# Patient Record
Sex: Female | Born: 1990 | Race: White | Hispanic: No | Marital: Married | Smoking: Current every day smoker
Health system: Southern US, Community
[De-identification: ages and names within clinical notes are randomized; demographics above are authoritative.]

## PROBLEM LIST (undated history)

## (undated) DIAGNOSIS — F329 Major depressive disorder, single episode, unspecified: Secondary | ICD-10-CM

## (undated) DIAGNOSIS — L709 Acne, unspecified: Secondary | ICD-10-CM

## (undated) DIAGNOSIS — F32A Depression, unspecified: Secondary | ICD-10-CM

## (undated) DIAGNOSIS — F4310 Post-traumatic stress disorder, unspecified: Secondary | ICD-10-CM

## (undated) DIAGNOSIS — K219 Gastro-esophageal reflux disease without esophagitis: Secondary | ICD-10-CM

## (undated) HISTORY — DX: Gastro-esophageal reflux disease without esophagitis: K21.9

## (undated) HISTORY — DX: Post-traumatic stress disorder, unspecified: F43.10

## (undated) HISTORY — DX: Depression, unspecified: F32.A

## (undated) HISTORY — DX: Major depressive disorder, single episode, unspecified: F32.9

## (undated) HISTORY — DX: Acne, unspecified: L70.9

---

## 2016-01-26 ENCOUNTER — Ambulatory Visit (INDEPENDENT_AMBULATORY_CARE_PROVIDER_SITE_OTHER): Payer: BLUE CROSS/BLUE SHIELD | Admitting: Physician Assistant

## 2016-01-26 ENCOUNTER — Encounter: Payer: Self-pay | Admitting: Physician Assistant

## 2016-01-26 DIAGNOSIS — M542 Cervicalgia: Secondary | ICD-10-CM

## 2016-01-26 DIAGNOSIS — F4310 Post-traumatic stress disorder, unspecified: Secondary | ICD-10-CM | POA: Insufficient documentation

## 2016-01-26 DIAGNOSIS — R200 Anesthesia of skin: Secondary | ICD-10-CM | POA: Insufficient documentation

## 2016-01-26 DIAGNOSIS — M5030 Other cervical disc degeneration, unspecified cervical region: Secondary | ICD-10-CM | POA: Insufficient documentation

## 2016-01-26 DIAGNOSIS — K219 Gastro-esophageal reflux disease without esophagitis: Secondary | ICD-10-CM | POA: Insufficient documentation

## 2016-01-26 DIAGNOSIS — F17200 Nicotine dependence, unspecified, uncomplicated: Secondary | ICD-10-CM | POA: Insufficient documentation

## 2016-01-26 LAB — CBC WITH DIFFERENTIAL/PLATELET
Basophils Absolute: 0 cells/uL (ref 0–200)
Basophils Relative: 0 %
Eosinophils Absolute: 132 {cells}/uL (ref 15–500)
Eosinophils Relative: 2 %
HCT: 42.3 % (ref 35.0–45.0)
Hemoglobin: 13.9 g/dL (ref 11.7–15.5)
Lymphocytes Relative: 35 %
Lymphs Abs: 2310 {cells}/uL (ref 850–3900)
MCH: 29.6 pg (ref 27.0–33.0)
MCHC: 32.9 g/dL (ref 32.0–36.0)
MCV: 90 fL (ref 80.0–100.0)
MPV: 10.8 fL (ref 7.5–12.5)
Monocytes Absolute: 330 {cells}/uL (ref 200–950)
Monocytes Relative: 5 %
Neutro Abs: 3828 {cells}/uL (ref 1500–7800)
Neutrophils Relative %: 58 %
Platelets: 206 K/uL (ref 140–400)
RBC: 4.7 MIL/uL (ref 3.80–5.10)
RDW: 13.3 % (ref 11.0–15.0)
WBC: 6.6 K/uL (ref 3.8–10.8)

## 2016-01-26 LAB — COMPREHENSIVE METABOLIC PANEL
Albumin: 4.3 g/dL (ref 3.6–5.1)
CO2: 26 mmol/L (ref 20–31)
Chloride: 107 mmol/L (ref 98–110)
Creat: 0.74 mg/dL (ref 0.50–1.10)
Potassium: 4.7 mmol/L (ref 3.5–5.3)
Sodium: 138 mmol/L (ref 135–146)

## 2016-01-26 LAB — COMPREHENSIVE METABOLIC PANEL WITH GFR
ALT: 12 U/L (ref 6–29)
AST: 18 U/L (ref 10–30)
Alkaline Phosphatase: 63 U/L (ref 33–115)
BUN: 10 mg/dL (ref 7–25)
Calcium: 9.1 mg/dL (ref 8.6–10.2)
Glucose, Bld: 93 mg/dL (ref 65–99)
Total Bilirubin: 0.4 mg/dL (ref 0.2–1.2)
Total Protein: 7 g/dL (ref 6.1–8.1)

## 2016-01-26 MED ORDER — MELOXICAM 15 MG PO TABS: ORAL_TABLET | ORAL | 3 refills | Status: AC

## 2016-01-26 MED ORDER — PREDNISONE 50 MG PO TABS: ORAL_TABLET | ORAL | 0 refills | Status: DC

## 2016-01-26 MED ORDER — CYCLOBENZAPRINE HCL 10 MG PO TABS: ORAL_TABLET | ORAL | 0 refills | Status: DC

## 2016-01-26 NOTE — Progress Notes (Signed)
   Subjective:    I'm seeing this patient as a consultation for: Gena Frayharley Cummings, PA-C   CC: Neck pain, migratory neurologic symptoms  HPI: This is a pleasant 26 year old female, she has obtained all of her previous care through the [facility]. Unfortunately she's had neck pain for several years, she tells me that she occasionally gets numbness and tingling going down her left arm or right arm, she's also had tingling, numbness going into her head, face, sometimes bilateral, sometimes unilateral, as well as down into her legs and her back. Symptoms are moderate, persistent. No bowel or bladder distention, saddle numbness, constitutional symptoms, no trauma.  Past medical history:  Negative.  See flowsheet/record as well for more information.  Surgical history: Negative.  See flowsheet/record as well for more information.  Family history: Negative.  See flowsheet/record as well for more information.  Social history: Negative.  See flowsheet/record as well for more information.  Allergies, and medications have been entered into the medical record, reviewed, and no changes needed.   Review of Systems: No headache, visual changes, nausea, vomiting, diarrhea, constipation, dizziness, abdominal pain, skin rash, fevers, chills, night sweats, weight loss, swollen lymph nodes, body aches, joint swelling, muscle aches, chest pain, shortness of breath, mood changes, visual or auditory hallucinations.   Objective:   General: Well Developed, well nourished, and in no acute distress.  Neuro/Psych: Alert and oriented x3, extra-ocular muscles intact, able to move all 4 extremities, sensation grossly intact. Skin: Warm and dry, no rashes noted.  Respiratory: Not using accessory muscles, speaking in full sentences, trachea midline.  Cardiovascular: Pulses palpable, no extremity edema. Abdomen: Does not appear distended. Neck: Negative spurling's Patient lacks some range of motion, particularly with rotation to the  left Grip strength and sensation normal in bilateral hands Strength good C4 to T1 distribution No sensory change to C4 to T1 Reflexes throughout even the lower extremity is are somewhat brisk, but a negative Hoffmann sign bilaterally and no clonus.  Impression and Recommendations:   This case required medical decision making of moderate complexity.  Numbness of face Migratory neurological symptoms with occasional numbness of extremities, face, neck, head. This is a young female with a family history of lupus. I am going to do a lupus panel as well as check a brain MRI with and without contrast looking for multiple sclerosis.   Neck pain Prednisone, meloxicam, Flexeril, formal physical therapy, neck x-rays.  Return to see me in one month.

## 2016-01-26 NOTE — Assessment & Plan Note (Signed)
Migratory neurological symptoms with occasional numbness of extremities, face, neck, head. This is a young female with a family history of lupus. I am going to do a lupus panel as well as check a brain MRI with and without contrast looking for multiple sclerosis.

## 2016-01-26 NOTE — Assessment & Plan Note (Signed)
Prednisone, meloxicam, Flexeril, formal physical therapy, neck x-rays.  Return to see me in one month.

## 2016-01-26 NOTE — Patient Instructions (Addendum)
Please make an appointment to follow-up with Dr. Benjamin Stainhekkekandam (sports medicine) in 1 month Referral has been placed for Physical Therapy. They will contact you or you can make an appointment in person today Go [facility] for your [facility] work

## 2016-01-26 NOTE — Progress Notes (Signed)
HPI:                                                                Debra OfficerBriana Ray is a 26 y.o. female who [facility]: Primary Care Sports Medicine today to establish care   Patient is an [employer] veteran who receives her primary and psychiatric care at the [facility]. She is here for a second opinion on chronic neck/back pain and tremors.   Patient reports chronic neck and back pain since 2012 from [employer] [place]. Pain is worse on the right side of her neck, present in the morning and worsens throughout the day. Patient is concerned about worsening pain and new sensory symptoms. She reports headache with associated numbness and tingling of her scalp and face. These symptoms began approximately 1 year ago. She is currently taking Gabapentin TID, which is not helping. Naproxen did not help with pain.  Patient is a current everyday smoker. She was prescribed Chantix, but had to discontinue the pill due to pustular acne. Not currently taking Chantix because she has no form of contraception. She is considering LARCs. Plans to followup with her doctor at the [facility].  She recently relocated from [place] and is living in [place] with her husband and 2 young daughters.  Health Maintenance Health Maintenance  Topic Date Due  . HIV Screening  08/22/2004  . PAP SMEAR  12/15/2018  . TETANUS/TDAP  01/14/2025  . INFLUENZA VACCINE  Completed    GYN/Sexual Health  Menstrual status: having periods  Menses: regular  Last pap smear: 2017  History of abnormal pap smears: no  Sexually active: yes, 1 female partner (husband)  Current contraception: no  Health Habits  Diet:  Ex[facility]: [facility] not [facility] or [facility]  ETOH: no  Tobacco: 1/2 ppd   Drugs:  Dental Exam:   Eye Exam:   Past Medical History:  Diagnosis Date  . Depression   . GERD (gastroesophageal reflux disease)   . PTSD (post-traumatic stress disorder)    No past surgical history  on file. Social History  Substance Use Topics  . Smoking status: Current Every Day Smoker    Packs/day: 0.50    Types: Cigarettes  . Smokeless tobacco: Never Used  . Alcohol use No   family history includes Fibromyalgia in her mother; Hypertension in her maternal grandfather and maternal grandmother; Lupus in her mother; Non-Hodgkin's lymphoma in her maternal grandmother.  Review of Systems  Constitutional: Positive for malaise/fatigue. Negative for chills, fever and weight loss.  Eyes: Positive for blurred vision (wears corrective lenses).  Respiratory: Negative for shortness of breath.   Cardiovascular: Negative for chest pain, palpitations and leg swelling.  Gastrointestinal: Positive for heartburn. Negative for blood in stool, constipation and diarrhea.  Genitourinary: Negative.   Musculoskeletal: Positive for back pain, myalgias and neck pain.  Neurological: Positive for tingling (hands), tremors, sensory change (hands) and headaches. Negative for focal weakness.       Paresthesias of the scalp and face + b/l hands in C7/C8 distribution  Psychiatric/Behavioral: The patient is nervous/anxious (ptsd).        Nightmares     Medications: Current Outpatient Prescriptions  Medication Sig Dispense Refill  . GABAPENTIN PO Take by mouth.    .Marland Kitchen  NAPROXEN PO Take by mouth.    . prazosin (MINIPRESS) 1 MG capsule Take 1 mg by mouth at bedtime.    Marland Kitchen RANITIDINE HCL PO Take by mouth.    . Zolpidem Tartrate (AMBIEN PO) Take by mouth.    . cyclobenzaprine (FLEXERIL) 10 MG tablet One half tab PO qHS, then increase gradually to one tab TID. 30 tablet 0  . meloxicam (MOBIC) 15 MG tablet One tab PO qAM with breakfast for 2 weeks, then daily prn pain. 30 tablet 3  . predniSONE (DELTASONE) 50 MG tablet One tab PO daily for 5 days. 5 tablet 0   No current facility-administered medications for this visit.    Allergies  Allergen Reactions  . Duloxetine        Objective:  BP 107/71   Pulse  74   Ht 5' (1.524 m)   Wt 135 lb (61.2 kg)   LMP 01/19/2016   BMI 26.37 kg/m  Gen: well-groomed, cooperative, not ill-appearing, no distress HEENT: normal conjunctiva, TM's clear, oropharynx clear, moist mucus membranes, no thyromegaly or tenderness Lungs: Normal work of breathing, clear to auscultation bilaterally Heart: Normal rate, regular rhythm, s1 and s2 distinct, no murmurs, clicks or rubs appreciated on this exam, no carotid bruit Abd: Soft. Nondistended, Nontender Neuro: alert and oriented x 3, EOM's intact, PERRLA, DTR's intact MSK: strength 5/5 and symmetric, normal gait Extremities: distal pulses intact, no peripheral edema Skin: warm and dry, no rashes or lesions on exposed skin Psych: flat affect, depressed mood, normal speech and thought content   No results found for this or any previous visit (from the past 72 hour(s)). No results found.    Assessment and Plan: 26 y.o. female with   Neck pain - consulted Dr. Benjamin Stain, Sports Medicine (see consult note). He has taken over care of musculoskeletal and neurological complaints - DG Cervical Spine Complete; Future - predniSONE (DELTASONE) 50 MG tablet; One tab PO daily for 5 days.  Dispense: 5 tablet; Refill: 0 - meloxicam (MOBIC) 15 MG tablet; One tab PO qAM with breakfast for 2 weeks, then daily prn pain.  Dispense: 30 tablet; Refill: 3 - cyclobenzaprine (FLEXERIL) 10 MG tablet; One half tab PO qHS, then increase gradually to one tab TID.  Dispense: 30 tablet; Refill: 0 - Ambulatory referral to Physical Therapy  Numbness of face - CBC with Differential/Platelet - Comprehensive metabolic panel - Sedimentation rate - ANA - MR BRAIN W WO CONTRAST   Patient education and anticipatory guidance given Patient agrees with treatment plan Follow-up in 1 month with Sports Medicine or sooner as needed  Levonne Hubert PA-C

## 2016-01-27 LAB — SEDIMENTATION RATE: Sed Rate: 1 mm/h (ref 0–20)

## 2016-01-29 ENCOUNTER — Ambulatory Visit (INDEPENDENT_AMBULATORY_CARE_PROVIDER_SITE_OTHER): Payer: BLUE CROSS/BLUE SHIELD

## 2016-01-29 DIAGNOSIS — R531 Weakness: Secondary | ICD-10-CM

## 2016-01-29 DIAGNOSIS — H538 Other visual disturbances: Secondary | ICD-10-CM

## 2016-01-29 DIAGNOSIS — R2681 Unsteadiness on feet: Secondary | ICD-10-CM

## 2016-01-29 DIAGNOSIS — R200 Anesthesia of skin: Secondary | ICD-10-CM

## 2016-01-29 LAB — ANA: Anti Nuclear Antibody(ANA): NEGATIVE

## 2016-01-29 MED ORDER — GADOBENATE DIMEGLUMINE 529 MG/ML IV SOLN
12.0000 mL | Freq: Once | INTRAVENOUS | Status: AC | PRN
  Administered 2016-01-29: 12 mL via INTRAVENOUS

## 2016-02-23 ENCOUNTER — Ambulatory Visit: Payer: BLUE CROSS/BLUE SHIELD | Admitting: Sports Medicine

## 2016-02-26 ENCOUNTER — Ambulatory Visit (INDEPENDENT_AMBULATORY_CARE_PROVIDER_SITE_OTHER): Payer: BLUE CROSS/BLUE SHIELD

## 2016-02-26 ENCOUNTER — Ambulatory Visit (INDEPENDENT_AMBULATORY_CARE_PROVIDER_SITE_OTHER): Payer: BLUE CROSS/BLUE SHIELD | Admitting: Sports Medicine

## 2016-02-26 DIAGNOSIS — G8929 Other chronic pain: Secondary | ICD-10-CM | POA: Diagnosis not present

## 2016-02-26 DIAGNOSIS — M542 Cervicalgia: Secondary | ICD-10-CM | POA: Diagnosis not present

## 2016-02-26 DIAGNOSIS — F4310 Post-traumatic stress disorder, unspecified: Secondary | ICD-10-CM

## 2016-02-26 MED ORDER — DULOXETINE HCL 30 MG PO CPEP: 30.0000 mg | ORAL_CAPSULE | Freq: Every day | ORAL | 3 refills | Status: DC

## 2016-02-26 NOTE — Assessment & Plan Note (Addendum)
Considering her PTSD, and vague reports of anxiety and panic associated with neck pain are going to add Cymbalta. She has very high PHQ9 and GAD7 scores. Return to see me in one month, we can repeat a PHQ9 and GAD7 that time. A do expect her uneasiness as well as her neck pain to improve with Cymbalta.

## 2016-02-26 NOTE — Progress Notes (Signed)
  Subjective:    CC: Follow-up  HPI: This is a pleasant 26 year old female, she is here for follow-up of neck pain, we treated her aggressively with prednisone, Flexeril, meloxicam as well as formal physical therapy which she has been doing at the [facility]. Has not yet gotten her neck x-ray but feels the symptoms are improving.  On further questioning she does feel some degree of uneasiness, and a sensation of her body vibrating. She does have a history of PTSD, and she has severe difficulty sleeping, poor energy, difficulty concentrating, psychomotor retardation, moderate anhedonia, depressed mood, changes in her appetite, and mild guilt, no suicidal or homicidal ideation, she also has severe nervousness, worrying about different things, trouble relaxing, irritability, fever of impending doom and moderate difficulty controlling her worry and restlessness.  Past medical history:  Negative.  See flowsheet/record as well for more information.  Surgical history: Negative.  See flowsheet/record as well for more information.  Family history: Negative.  See flowsheet/record as well for more information.  Social history: Negative.  See flowsheet/record as well for more information.  Allergies, and medications have been entered into the medical record, reviewed, and no changes needed.   Review of Systems: No fevers, chills, night sweats, weight loss, chest pain, or shortness of breath.   Objective:    General: Well Developed, well nourished, and in no acute distress.  Neuro: Alert and oriented x3, extra-ocular muscles intact, sensation grossly intact.  HEENT: Normocephalic, atraumatic, pupils equal round reactive to light, neck supple, no masses, no lymphadenopathy, thyroid nonpalpable.  Skin: Warm and dry, no rashes. Cardiac: Regular rate and rhythm, no murmurs rubs or gallops, no lower extremity edema.  Respiratory: Clear to auscultation bilaterally. Not using accessory muscles, speaking in full  sentences. Neck: Negative spurling's Full neck range of motion Grip strength and sensation normal in bilateral hands Strength good C4 to T1 distribution No sensory change to C4 to T1 Reflexes normal  Impression and Recommendations:    Neck pain Improving significantly, doing physical therapy at the [facility]. She does seem to have some degree of anxiety and myofascial pain, so we are going to add Cymbalta to the regimen. Return to see me in one month, if positive she can also follow this up with PCP. Also encouraged her to go ahead and go downstairs to get her x-ray.  PTSD (post-traumatic stress disorder) Considering her PTSD, and vague reports of anxiety and panic associated with neck pain are going to add Cymbalta. Return to see me in one month, we can repeat a PHQ9 and GAD7 that time. A do expect her uneasiness as well as her neck pain to improve with Cymbalta.

## 2016-02-26 NOTE — Assessment & Plan Note (Signed)
Improving significantly, doing physical therapy at the [facility]. She does seem to have some degree of anxiety and myofascial pain, so we are going to add Cymbalta to the regimen. Return to see me in one month, if positive she can also follow this up with PCP. Also encouraged her to go ahead and go downstairs to get her x-ray.

## 2016-04-01 ENCOUNTER — Ambulatory Visit (INDEPENDENT_AMBULATORY_CARE_PROVIDER_SITE_OTHER): Payer: BLUE CROSS/BLUE SHIELD | Admitting: Sports Medicine

## 2016-04-01 DIAGNOSIS — M545 Low back pain, unspecified: Secondary | ICD-10-CM | POA: Insufficient documentation

## 2016-04-01 DIAGNOSIS — G8929 Other chronic pain: Secondary | ICD-10-CM | POA: Diagnosis not present

## 2016-04-01 DIAGNOSIS — M542 Cervicalgia: Secondary | ICD-10-CM | POA: Diagnosis not present

## 2016-04-01 DIAGNOSIS — F4310 Post-traumatic stress disorder, unspecified: Secondary | ICD-10-CM | POA: Diagnosis not present

## 2016-04-01 NOTE — Progress Notes (Signed)
  Subjective:    CC: Follow-up  HPI: PTSD/anxiety/depression: Some improvement in symptoms on 30 mg of Cymbalta. She did just see her psychiatrist to increase her Cymbalta to 60 mg.  Neck pain: Persistent, likely due to myofascial pain syndrome, x-rays were negative but she continues to have subjective pain after greater than 6 weeks of conservative measures.  Low back pain: Has had an MRI of the lumbar spine, I'm unsure of the results at this point, she does have a disc with the [facility] and agrees to get this for my review.  Past medical history:  Negative.  See flowsheet/record as well for more information.  Surgical history: Negative.  See flowsheet/record as well for more information.  Family history: Negative.  See flowsheet/record as well for more information.  Social history: Negative.  See flowsheet/record as well for more information.  Allergies, and medications have been entered into the medical record, reviewed, and no changes needed.   Review of Systems: No fevers, chills, night sweats, weight loss, chest pain, or shortness of breath.   Objective:    General: Well Developed, well nourished, and in no acute distress.  Neuro: Alert and oriented x3, extra-ocular muscles intact, sensation grossly intact.  HEENT: Normocephalic, atraumatic, pupils equal round reactive to light, neck supple, no masses, no lymphadenopathy, thyroid nonpalpable.  Skin: Warm and dry, no rashes. Cardiac: Regular rate and rhythm, no murmurs rubs or gallops, no lower extremity edema.  Respiratory: Clear to auscultation bilaterally. Not using accessory muscles, speaking in full sentences.  Impression and Recommendations:    PTSD (post-traumatic stress disorder) Though with significant anxiety and depressive symptoms on Cymbalta 30, her psychiatrist has increased her dose to 60 mg but she has not yet started taking the new dose. She can return to see her PCP regarding her anxiety, and repeat PHQ9 and  GAD7.  Neck pain Slight improvement, x-rays were negative but considering duration of pain and failure of conservative measures to close the loop we are going to go ahead and get an MRI. Do suspect this will end up being secondary to myofascial pain syndrome.  Low back pain without sciatica Patient [facility], she will get a disc for my review.

## 2016-04-01 NOTE — Assessment & Plan Note (Signed)
Though with significant anxiety and depressive symptoms on Cymbalta 30, her psychiatrist has increased her dose to 60 mg but she has not yet started taking the new dose. She can return to see her PCP regarding her anxiety, and repeat PHQ9 and GAD7.

## 2016-04-01 NOTE — Assessment & Plan Note (Signed)
[facility], she will get a disc for my review.

## 2016-04-01 NOTE — Assessment & Plan Note (Signed)
Slight improvement, x-rays were negative but considering duration of pain and failure of conservative measures to close the loop we are going to go ahead and get an MRI. Do suspect this will end up being secondary to myofascial pain syndrome.

## 2016-04-08 ENCOUNTER — Ambulatory Visit (INDEPENDENT_AMBULATORY_CARE_PROVIDER_SITE_OTHER): Payer: BLUE CROSS/BLUE SHIELD

## 2016-04-08 DIAGNOSIS — M50122 Cervical disc disorder at C5-C6 level with radiculopathy: Secondary | ICD-10-CM | POA: Diagnosis not present

## 2016-04-08 DIAGNOSIS — M542 Cervicalgia: Secondary | ICD-10-CM

## 2016-04-29 ENCOUNTER — Ambulatory Visit (INDEPENDENT_AMBULATORY_CARE_PROVIDER_SITE_OTHER): Payer: BLUE CROSS/BLUE SHIELD | Admitting: Sports Medicine

## 2016-04-29 ENCOUNTER — Encounter: Payer: Self-pay | Admitting: Sports Medicine

## 2016-04-29 DIAGNOSIS — M545 Low back pain: Secondary | ICD-10-CM

## 2016-04-29 DIAGNOSIS — G8929 Other chronic pain: Secondary | ICD-10-CM | POA: Diagnosis not present

## 2016-04-29 DIAGNOSIS — M5030 Other cervical disc degeneration, unspecified cervical region: Secondary | ICD-10-CM | POA: Diagnosis not present

## 2016-04-29 NOTE — Assessment & Plan Note (Signed)
I'm still waiting for her to get her lumbar spine MRI disc from [facility]. Her pain is axial and discogenic. It has improved significantly with a higher dose of Cymbalta but right now this trumps her neck pain.

## 2016-04-29 NOTE — Assessment & Plan Note (Signed)
With right sided periscapular radicular pain. There is a small C5-C6 broad-based protrusion. Not hurting enough yet to consider an injection however I am going to get her with a new physical therapist.

## 2016-04-29 NOTE — Progress Notes (Signed)
  Subjective:    CC: Follow-up  HPI: This is a pleasant 26 year old female, she has both cervical and lumbar degenerative disc disease, she has been working with physical therapist and is also on Cymbalta. Overall she's doing well, she does have some right sided periscapular pain related to her cervical disc protrusion, she's also got midline axial and discogenic low back pain, worse with sitting, flexion, Valsalva. She is doing some work with her physical therapist but expresses some dissatisfaction in her treatment. She is going to look into getting a severely and physical therapist.  Past medical history:  Negative.  See flowsheet/record as well for more information.  Surgical history: Negative.  See flowsheet/record as well for more information.  Family history: Negative.  See flowsheet/record as well for more information.  Social history: Negative.  See flowsheet/record as well for more information.  Allergies, and medications have been entered into the medical record, reviewed, and no changes needed.   Review of Systems: No fevers, chills, night sweats, weight loss, chest pain, or shortness of breath.   Objective:    General: Well Developed, well nourished, and in no acute distress.  Neuro: Alert and oriented x3, extra-ocular muscles intact, sensation grossly intact.  HEENT: Normocephalic, atraumatic, pupils equal round reactive to light, neck supple, no masses, no lymphadenopathy, thyroid nonpalpable.  Skin: Warm and dry, no rashes. Cardiac: Regular rate and rhythm, no murmurs rubs or gallops, no lower extremity edema.  Respiratory: Clear to auscultation bilaterally. Not using accessory muscles, speaking in full sentences.  Impression and Recommendations:    DDD (degenerative disc disease), cervical With right sided periscapular radicular pain. There is a small C5-C6 broad-based protrusion. Not hurting enough yet to consider an injection however I am going to get her with a new  physical therapist.  Low back pain without sciatica I'm still waiting for her to get her lumbar spine MRI disc [facility]. Her pain is axial and discogenic. It has improved significantly with a higher dose of Cymbalta but right now this trumps her neck pain.  I spent 25 minutes with this patient, greater than 50% was face-to-face time counseling regarding the above diagnoses

## 2016-05-01 ENCOUNTER — Ambulatory Visit: Payer: BLUE CROSS/BLUE SHIELD | Admitting: Physician Assistant

## 2016-05-08 ENCOUNTER — Ambulatory Visit (INDEPENDENT_AMBULATORY_CARE_PROVIDER_SITE_OTHER): Payer: BLUE CROSS/BLUE SHIELD | Admitting: Physician Assistant

## 2016-05-08 VITALS — BP 104/65 | HR 62 | Wt 130.0 lb

## 2016-05-08 DIAGNOSIS — F4310 Post-traumatic stress disorder, unspecified: Secondary | ICD-10-CM | POA: Diagnosis not present

## 2016-05-08 DIAGNOSIS — F331 Major depressive disorder, recurrent, moderate: Secondary | ICD-10-CM | POA: Diagnosis not present

## 2016-05-08 MED ORDER — BUPROPION HCL ER (XL) 150 MG PO TB24: 150.0000 mg | ORAL_TABLET | Freq: Every day | ORAL | 5 refills | Status: AC

## 2016-05-08 NOTE — Patient Instructions (Signed)
- Start Wellbutrin with your Cymbalta in the morning - Follow-up in 4 weeks   Living With Depression Everyone experiences occasional disappointment, sadness, and loss in their lives. When you are feeling down, blue, or sad for at least 2 weeks in a row, it may mean that you have depression. Depression can affect your thoughts and feelings, relationships, daily activities, and physical health. It is caused by changes in the way your brain functions. If you receive a diagnosis of depression, your health care provider will tell you which type of depression you have and what treatment options are available to you. If you are living with depression, there are ways to help you recover from it and also ways to prevent it from coming back. How to cope with lifestyle changes Coping with stress  Stress is your body's reaction to life changes and events, both good and bad. Stressful situations may include:  Getting married.  The death of a spouse.  Losing a job.  Retiring.  Having a baby. Stress can last just a few hours or it can be ongoing. Stress can play a major role in depression, so it is important to learn both how to cope with stress and how to think about it differently. Talk with your health care provider or a counselor if you would like to learn more about stress reduction. He or she may suggest some stress reduction techniques, such as:  Music therapy. This can include creating music or listening to music. Choose music that you enjoy and that inspires you.  Mindfulness-based meditation. This kind of meditation can be done while sitting or walking. It involves being aware of your normal breaths, rather than trying to control your breathing.  Centering prayer. This is a kind of meditation that involves focusing on a spiritual word or phrase. Choose a word, phrase, or sacred image that is meaningful to you and that brings you peace.  Deep breathing. To do this, expand your stomach and  inhale slowly through your nose. Hold your breath for 3-5 seconds, then exhale slowly, allowing your stomach muscles to relax.  Muscle relaxation. This involves intentionally tensing muscles then relaxing them. Choose a stress reduction technique that fits your lifestyle and personality. Stress reduction techniques take time and practice to develop. Set aside 5-15 minutes a day to do them. Therapists can offer training in these techniques. The training may be covered by some insurance plans. Other things you can do to manage stress include:  Keeping a stress diary. This can help you learn what triggers your stress and ways to control your response.  Understanding what your limits are and saying no to requests or events that lead to a schedule that is too full.  Thinking about how you respond to certain situations. You may not be able to control everything, but you can control how you react.  Adding humor to your life by watching funny films or TV shows.  Making time for activities that help you relax and not feeling guilty about spending your time this way. Medicines  Your health care provider may suggest certain medicines if he or she feels that they will help improve your condition. Avoid using alcohol and other substances that may prevent your medicines from working properly (may interact). It is also important to:  Talk with your pharmacist or health care provider about all the medicines that you take, their possible side effects, and what medicines are safe to take together.  Make it your goal to  take part in all treatment decisions (shared decision-making). This includes giving input on the side effects of medicines. It is best if shared decision-making with your health care provider is part of your total treatment plan. If your health care provider prescribes a medicine, you may not notice the full benefits of it for 4-8 weeks. Most people who are treated for depression need to be on  medicine for at least 6-12 months after they feel better. If you are taking medicines as part of your treatment, do not stop taking medicines without first talking to your health care provider. You may need to have the medicine slowly decreased (tapered) over time to decrease the risk of harmful side effects. Relationships  Your health care provider may suggest family therapy along with individual therapy and drug therapy. While there may not be family problems that are causing you to feel depressed, it is still important to make sure your family learns as much as they can about your mental health. Having your family's support can help make your treatment successful. How to recognize changes in your condition Everyone has a different response to treatment for depression. Recovery from major depression happens when you have not had signs of major depression for two months. This may mean that you will start to:  Have more interest in doing activities.  Feel less hopeless than you did 2 months ago.  Have more energy.  Overeat less often, or have better or improving appetite.  Have better concentration. Your health care provider will work with you to decide the next steps in your recovery. It is also important to recognize when your condition is getting worse. Watch for these signs:  Having fatigue or low energy.  Eating too much or too little.  Sleeping too much or too little.  Feeling restless, agitated, or hopeless.  Having trouble concentrating or making decisions.  Having unexplained physical complaints.  Feeling irritable, angry, or aggressive. Get help as soon as you or your family members notice these symptoms coming back. How to get support and help from others How to talk with friends and family members about your condition  Talking to friends and family members about your condition can provide you with one way to get support and guidance. Reach out to trusted friends or family  members, explain your symptoms to them, and let them know that you are working with a health care provider to treat your depression. Financial resources  Not all insurance plans cover mental health care, so it is important to check with your insurance carrier. If paying for co-pays or counseling services is a problem, search for a local or county mental health care center. They may be able to offer public mental health care services at low or no cost when you are not able to see a private health care provider. If you are taking medicine for depression, you may be able to get the generic form, which may be less expensive. Some makers of prescription medicines also offer help to patients who cannot afford the medicines they need. Follow these instructions at home:  Get the right amount and quality of sleep.  Cut down on using caffeine, tobacco, alcohol, and other potentially harmful substances.  Try to exercise, such as walking or lifting small weights.  Take over-the-counter and prescription medicines only as told by your health care provider.  Eat a healthy diet that includes plenty of vegetables, fruits, whole grains, low-fat dairy products, and lean protein. Do not eat  a lot of foods that are high in solid fats, added sugars, or salt.  Keep all follow-up visits as told by your health care provider. This is important. Contact a health care provider if:  You stop taking your antidepressant medicines, and you have any of these symptoms:  Nausea.  Headache.  Feeling lightheaded.  Chills and body aches.  Not being able to sleep (insomnia).  You or your friends and family think your depression is getting worse. Get help right away if:  You have thoughts of hurting yourself or others. If you ever feel like you may hurt yourself or others, or have thoughts about taking your own life, get help right away. You can go to your nearest emergency department or call:  Your local emergency  services (911 in the [place]  A suicide crisis helpline, such as the [facility] at 385-551-7865. This is open 24-hours a day. Summary  If you are living with depression, there are ways to help you recover from it and also ways to prevent it from coming back.  Work with your health care team to create a management plan that includes counseling, stress management techniques, and healthy lifestyle habits. This information is not intended to replace advice given to you by your health care provider. Make sure you discuss any questions you have with your health care provider. Document Released: 12/04/2015 Document Revised: 12/04/2015 Document Reviewed: 12/04/2015 Elsevier Interactive Patient Education  2017 [employer].

## 2016-05-08 NOTE — Progress Notes (Signed)
HPI:                                                                Debra Ray is a 26 y.o. female who presents to [facility] today for depression/PTSD follow-up  Depression/Anxiety: taking Cymbalta for the last month without difficulty. [facility] and is taking Prazosin nightly for nightmares. She feels the Cymbalta is helping her. She continues to endorse some fatigue and irritability. Denies symptoms of mania/hypomania. Denies suicidal thinking. Denies auditory/visual hallucinations.   Past Medical History:  Diagnosis Date  . Acne   . Depression   . GERD (gastroesophageal reflux disease)   . PTSD (post-traumatic stress disorder)    No past surgical history on file. Social History  Substance Use Topics  . Smoking status: Current Every Day Smoker    Packs/day: 0.50    Types: Cigarettes  . Smokeless tobacco: Never Used  . Alcohol use No   family history includes Fibromyalgia in her mother; Hypertension in her maternal grandfather and maternal grandmother; Lupus in her mother; Non-Hodgkin's lymphoma in her maternal grandmother.  ROS: negative except as noted in the HPI  Medications: Current Outpatient Prescriptions  Medication Sig Dispense Refill  . DULoxetine (CYMBALTA) 60 MG capsule Take 1 capsule (60 mg total) by mouth daily.    . meloxicam (MOBIC) 15 MG tablet One tab PO qAM with breakfast for 2 weeks, then daily prn pain. 30 tablet 3  . prazosin (MINIPRESS) 1 MG capsule Take 1 mg by mouth at bedtime.    Marland Kitchen RANITIDINE HCL PO Take by mouth.    . Zolpidem Tartrate (AMBIEN PO) Take by mouth.     No current facility-administered medications for this visit.    Allergies  Allergen Reactions  . Dilaudid [Hydromorphone]        Objective:  BP 104/65   Pulse 62   Wt 130 lb (59 kg)   BMI 25.39 kg/m  Gen: well-groomed, cooperative, not ill-appearing, no distress Pulm: Normal work of breathing,  normal phonation, clear to auscultation bilaterally CV: Normal rate, regular rhythm, s1 and s2 distinct, no murmurs, clicks or rubs  Neuro: alert and oriented x 3, EOM's intact, no tremor MSK: moving all extremities, normal gait and station, no peripheral edema Psych: good eye contact, blunted affect, depressed mood, normal speech and thought content   Depression screen [facility] 2/9 05/08/2016 04/01/2016 04/01/2016  Decreased Interest Down, Depressed, Hopeless PHQ - 2 Score Altered sleeping Tired, decreased energy Change in appetite Feeling bad or failure about yourself  Trouble concentrating Moving slowly or fidgety/restless Suicidal thoughts 0 0 0  PHQ-9 Score GAD 7 : Generalized Anxiety Score 05/08/2016 04/01/2016  Nervous, Anxious, on Edge 3 3  Control/stop worrying 1 2  Worry too much - different things 2 2  Trouble relaxing 2 2  Restless 1 1  Easily annoyed or irritable 3 2  Afraid - awful might happen 2 1  Total GAD 7 Score 14 13  Assessment and Plan: 26 y.o. female with   1. Moderate episode of recurrent major depressive disorder, PTSD - PHQ9 score only down 1 point since starting Cymbalta - cont Cymbalta  daily - will trial Wellbutrin augmentation. Hopefully this will not worsen anxiety. Patient has been on Wellbutrin in the past. She states it did not work for her depression after pregnancy, but she was taking it without any other medications - buPROPion (WELLBUTRIN XL) 150 MG 24 hr tablet; Take 1 tablet (150 mg total) by mouth daily.  Dispense: 30 tablet; Refill: 5 - follow-up in 4 weeks. Instructed patient to follow-up sooner if she notices worsening anxiety or intolerance to Wellbutrin    Patient education and anticipatory guidance given Patient agrees with treatment plan Follow-up in 4 weeks or sooner as needed if symptoms worsen or fail to improve  Levonne Hubert PA-C

## 2016-05-10 DIAGNOSIS — F331 Major depressive disorder, recurrent, moderate: Secondary | ICD-10-CM | POA: Insufficient documentation

## 2016-06-05 ENCOUNTER — Ambulatory Visit: Payer: BLUE CROSS/BLUE SHIELD | Admitting: Physician Assistant

## 2018-03-28 IMAGING — MR MR HEAD WO/W CM
10 of 11 series · 40 of 48 positions shown · IV contrast (12 ML MULTIHANCE)
Comparison: None.

CLINICAL DATA: Pressure and right facial numbness. Weakness in all
extremities and balance issues. Blurred vision. Symptoms for 5
years.

EXAM:
MRI HEAD WITHOUT AND WITH CONTRAST
TECHNIQUE: Multiplanar, multiecho pulse sequences of the brain and surrounding
structures were obtained without and with intravenous contrast.
CONTRAST:  12mL MULTIHANCE GADOBENATE DIMEGLUMINE 529 MG/ML IV SOLN

[Series 2: T1 · sagittal · 5.0mm · 0.45mm/px · 3 of 23 slices shown]
[im 1/23]
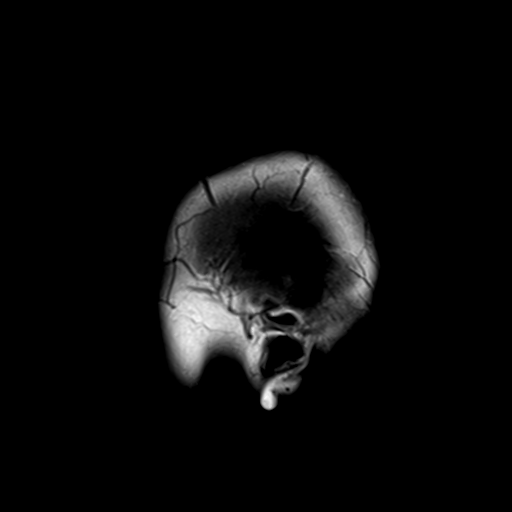
[im 8/23]
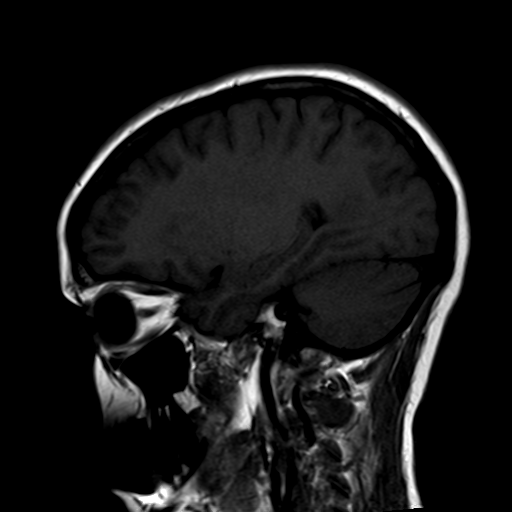
[im 15/23]
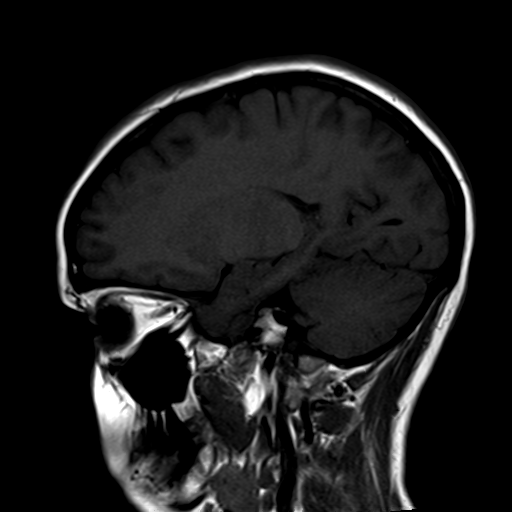

[Series 4: DWI · axial · 3.0mm · 1.20mm/px · z∈[-63,+98]mm · 6 of 55 slices shown (1 of 2)]
[im 1/55]
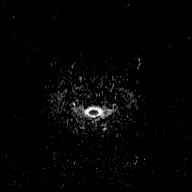
[im 11/55]
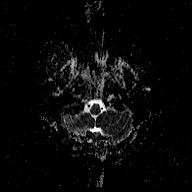
[im 22/55]
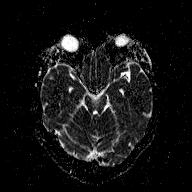
[im 33/55]
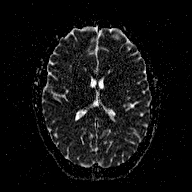
[im 44/55]
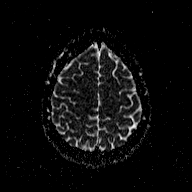
[im 55/55]
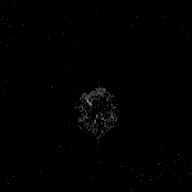

[Series 5: T2 · axial · 5.0mm · 0.72mm/px · z∈[-60,+95]mm · 3 of 25 slices shown (1 of 2)]
[im 1/25]
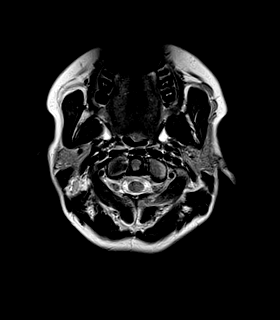
[im 13/25]
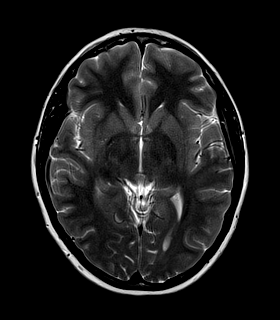
[im 25/25]
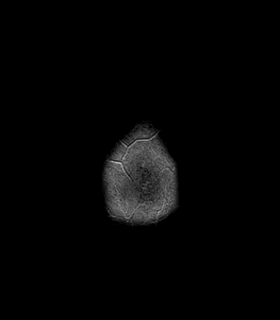

[Series 6: FLAIR · axial · 5.0mm · 0.45mm/px · z∈[-60,+95]mm · 3 of 25 slices shown (1 of 2)]
[im 1/25]
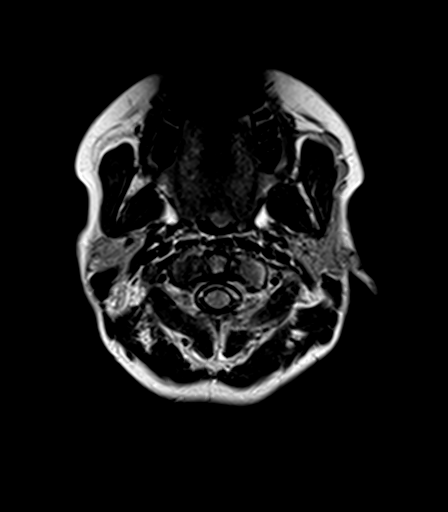
[im 13/25]
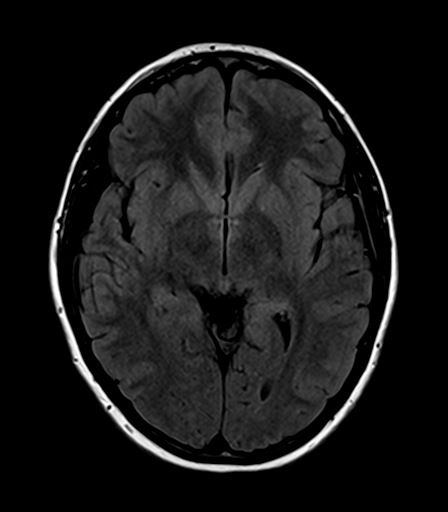
[im 25/25]
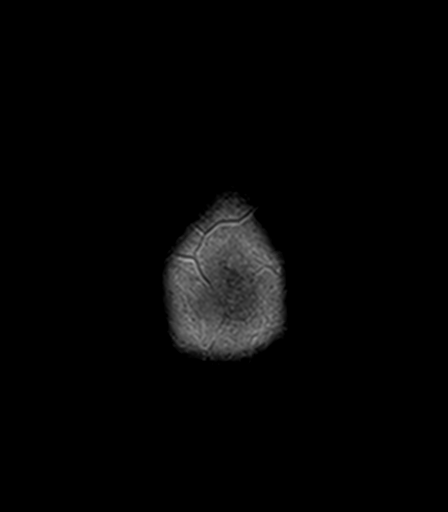

[Series 7: T2 · axial · 5.0mm · 0.72mm/px · z∈[-60,+95]mm · 3 of 25 slices shown (2 of 2)]
[im 1/25]
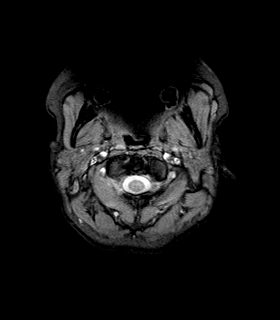
[im 13/25]
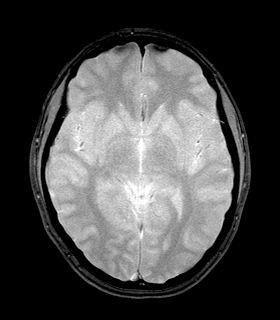
[im 25/25]
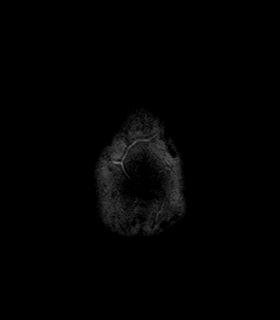

[Series 9: FLAIR · sagittal · 5.0mm · 0.45mm/px · 3 of 23 slices shown (2 of 2)]
[im 1/23]
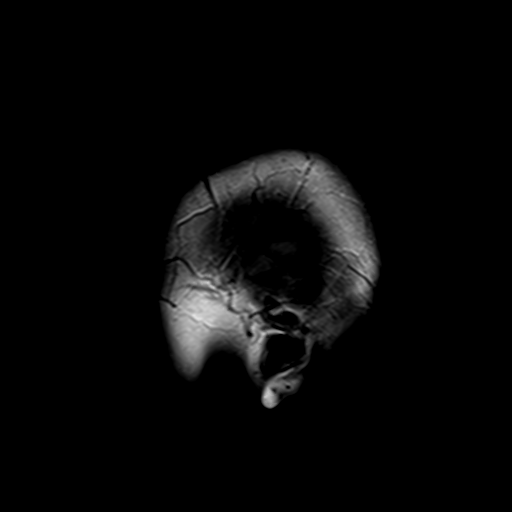
[im 12/23]
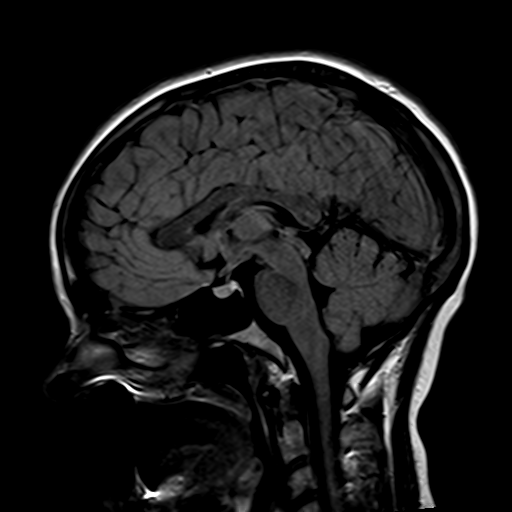
[im 23/23]
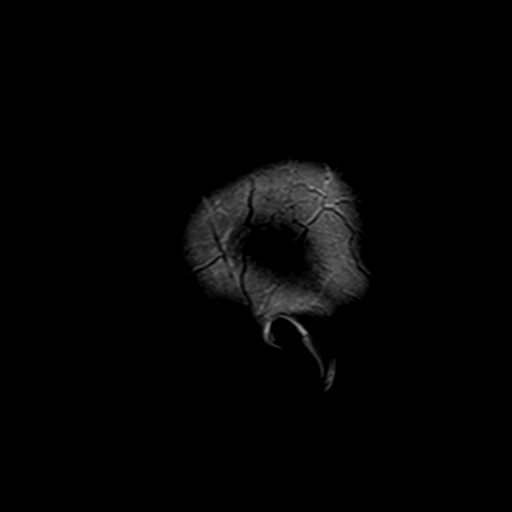

[Series 10: T2 post-contrast · coronal · 5.0mm · 0.45mm/px · 3 of 30 slices shown]
[im 1/30]
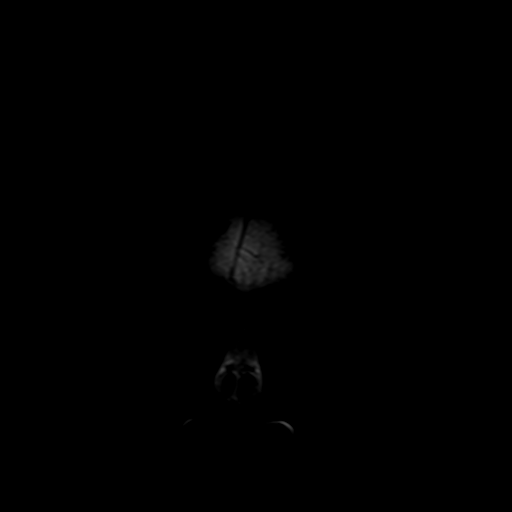
[im 15/30]
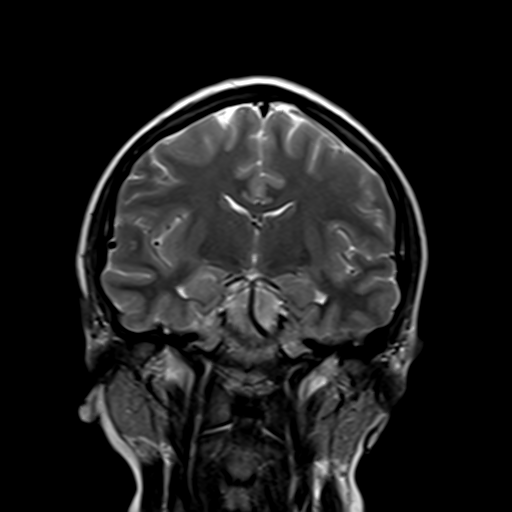
[im 30/30]
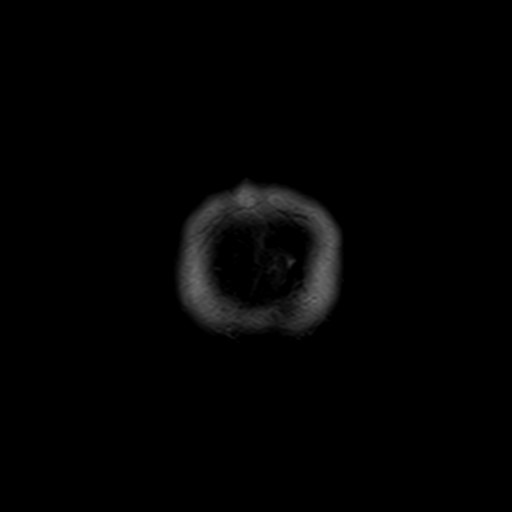

[Series 11: T1 post-contrast · axial · 3.0mm · 1.00mm/px · z∈[-78,+110]mm · 7 of 64 slices shown (1 of 2)]
[im 1/64]
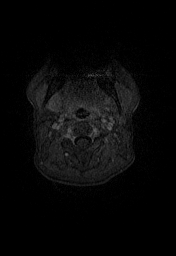
[im 11/64]
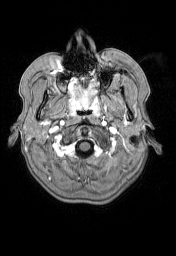
[im 22/64]
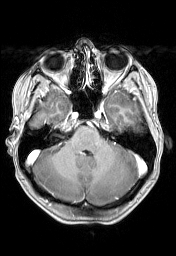
[im 32/64]
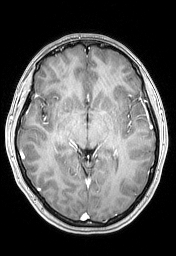
[im 43/64]
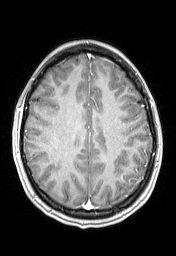
[im 53/64]
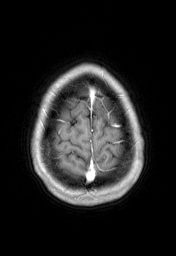
[im 64/64]
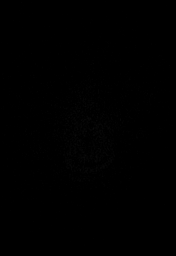

[Series 12: T1 post-contrast · coronal · 5.0mm · 0.45mm/px · 3 of 30 slices shown (2 of 2)]
[im 1/30]
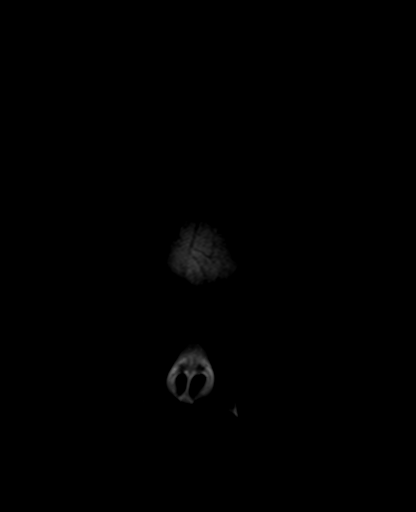
[im 15/30]
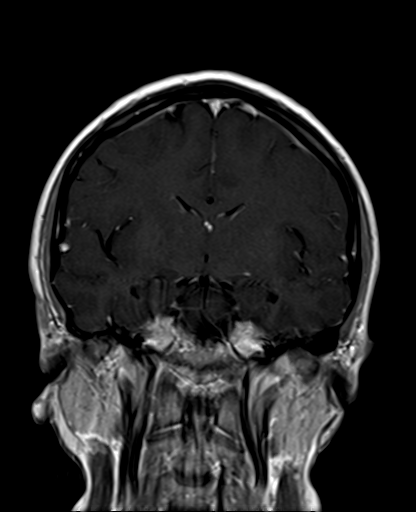
[im 30/30]
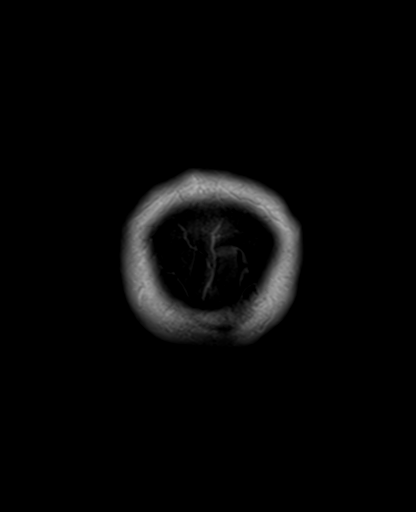

[Series 100: DWI · axial · 3.0mm · 1.20mm/px · z∈[-63,+98]mm · 6 of 55 slices shown (2 of 2)]
[im 1/55]
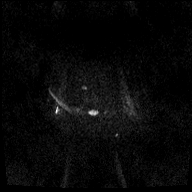
[im 11/55]
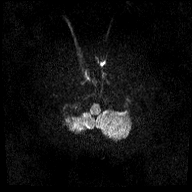
[im 22/55]
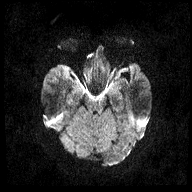
[im 33/55]
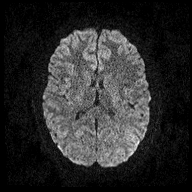
[im 44/55]
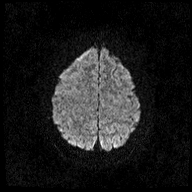
[im 55/55]
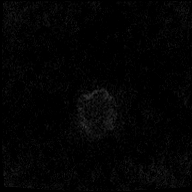

[40 of 48 positions shown; findings below may reference images not displayed]

FINDINGS: Brain: No acute or remote infarction, hemorrhage, hydrocephalus,
extra-axial collection or mass lesion. No atrophy or white matter
disease. No abnormality noted along the right trigeminal nerve from
pons to skullbase.

Vascular: Normal flow voids

Skull and upper cervical spine: Negative

Sinuses/Orbits: No active sinusitis. Negative orbits. Leftward nasal
septal deviation.
IMPRESSION: Normal brain MRI.
